# Patient Record
Sex: Female | Born: 1970 | Race: Black or African American | Hispanic: No | Marital: Married | Smoking: Never smoker
Health system: Southern US, Community
[De-identification: ages and names within clinical notes are randomized; demographics above are authoritative.]

---

## 2004-06-19 ENCOUNTER — Inpatient Hospital Stay (HOSPITAL_COMMUNITY): Admission: AD | Admit: 2004-06-19 | Discharge: 2004-06-20 | Payer: Self-pay | Admitting: Family Medicine

## 2004-08-02 ENCOUNTER — Emergency Department (HOSPITAL_COMMUNITY): Admission: EM | Admit: 2004-08-02 | Discharge: 2004-08-02 | Payer: Self-pay | Admitting: *Deleted

## 2004-08-08 ENCOUNTER — Emergency Department (HOSPITAL_COMMUNITY): Admission: EM | Admit: 2004-08-08 | Discharge: 2004-08-08 | Payer: Self-pay | Admitting: Family Medicine

## 2005-04-26 ENCOUNTER — Other Ambulatory Visit: Admission: RE | Admit: 2005-04-26 | Discharge: 2005-04-26 | Payer: Self-pay | Admitting: Obstetrics and Gynecology

## 2007-01-04 ENCOUNTER — Inpatient Hospital Stay (HOSPITAL_COMMUNITY): Admission: AD | Admit: 2007-01-04 | Discharge: 2007-01-07 | Payer: Self-pay | Admitting: Obstetrics and Gynecology

## 2007-04-18 ENCOUNTER — Emergency Department: Payer: Self-pay | Admitting: Emergency Medicine

## 2010-11-11 ENCOUNTER — Other Ambulatory Visit: Payer: Self-pay | Admitting: Obstetrics and Gynecology

## 2013-03-24 ENCOUNTER — Other Ambulatory Visit: Payer: Self-pay

## 2013-03-24 DIAGNOSIS — Z1231 Encounter for screening mammogram for malignant neoplasm of breast: Secondary | ICD-10-CM

## 2013-04-04 ENCOUNTER — Ambulatory Visit
Admission: RE | Admit: 2013-04-04 | Discharge: 2013-04-04 | Disposition: A | Discharge status: Home / Self Care | Payer: BC Managed Care – PPO | Source: Ambulatory Visit

## 2013-04-04 ENCOUNTER — Ambulatory Visit: Payer: Self-pay

## 2013-04-04 DIAGNOSIS — Z1231 Encounter for screening mammogram for malignant neoplasm of breast: Secondary | ICD-10-CM

## 2015-12-13 ENCOUNTER — Other Ambulatory Visit: Payer: Self-pay | Admitting: Internal Medicine

## 2015-12-13 DIAGNOSIS — Z1231 Encounter for screening mammogram for malignant neoplasm of breast: Secondary | ICD-10-CM

## 2015-12-21 ENCOUNTER — Ambulatory Visit
Admission: RE | Admit: 2015-12-21 | Discharge: 2015-12-21 | Disposition: A | Discharge status: Home / Self Care | Payer: BC Managed Care – PPO | Source: Ambulatory Visit | Attending: Internal Medicine | Admitting: Internal Medicine

## 2015-12-21 DIAGNOSIS — Z1231 Encounter for screening mammogram for malignant neoplasm of breast: Secondary | ICD-10-CM | POA: Insufficient documentation

## 2015-12-27 ENCOUNTER — Other Ambulatory Visit: Payer: Self-pay | Admitting: Internal Medicine

## 2015-12-27 DIAGNOSIS — R928 Other abnormal and inconclusive findings on diagnostic imaging of breast: Secondary | ICD-10-CM

## 2016-01-19 ENCOUNTER — Ambulatory Visit
Admission: RE | Admit: 2016-01-19 | Discharge: 2016-01-19 | Disposition: A | Discharge status: Home / Self Care | Payer: BC Managed Care – PPO | Source: Ambulatory Visit | Attending: Internal Medicine | Admitting: Internal Medicine

## 2016-01-19 DIAGNOSIS — N63 Unspecified lump in breast: Secondary | ICD-10-CM | POA: Diagnosis not present

## 2016-01-19 DIAGNOSIS — R928 Other abnormal and inconclusive findings on diagnostic imaging of breast: Secondary | ICD-10-CM

## 2017-02-19 ENCOUNTER — Other Ambulatory Visit: Payer: Self-pay | Admitting: Internal Medicine

## 2017-02-19 DIAGNOSIS — Z1231 Encounter for screening mammogram for malignant neoplasm of breast: Secondary | ICD-10-CM

## 2017-03-09 ENCOUNTER — Ambulatory Visit: Payer: BC Managed Care – PPO

## 2017-08-08 ENCOUNTER — Ambulatory Visit
Admission: RE | Admit: 2017-08-08 | Discharge: 2017-08-08 | Disposition: A | Discharge status: Home / Self Care | Payer: BC Managed Care – PPO | Source: Ambulatory Visit | Attending: Internal Medicine | Admitting: Internal Medicine

## 2017-08-08 DIAGNOSIS — Z1231 Encounter for screening mammogram for malignant neoplasm of breast: Secondary | ICD-10-CM | POA: Insufficient documentation

## 2017-12-25 ENCOUNTER — Other Ambulatory Visit: Payer: Self-pay | Admitting: Internal Medicine

## 2017-12-25 DIAGNOSIS — Z1231 Encounter for screening mammogram for malignant neoplasm of breast: Secondary | ICD-10-CM

## 2018-10-25 ENCOUNTER — Ambulatory Visit
Admission: RE | Admit: 2018-10-25 | Discharge: 2018-10-25 | Disposition: A | Discharge status: Home / Self Care | Payer: BC Managed Care – PPO | Source: Ambulatory Visit | Attending: Internal Medicine | Admitting: Internal Medicine

## 2018-10-25 DIAGNOSIS — Z1231 Encounter for screening mammogram for malignant neoplasm of breast: Secondary | ICD-10-CM | POA: Diagnosis present

## 2018-10-28 ENCOUNTER — Other Ambulatory Visit: Payer: Self-pay | Admitting: Internal Medicine

## 2018-10-29 ENCOUNTER — Other Ambulatory Visit: Payer: Self-pay | Admitting: Internal Medicine

## 2018-10-29 DIAGNOSIS — R928 Other abnormal and inconclusive findings on diagnostic imaging of breast: Secondary | ICD-10-CM

## 2018-10-29 DIAGNOSIS — N6320 Unspecified lump in the left breast, unspecified quadrant: Secondary | ICD-10-CM

## 2018-11-07 ENCOUNTER — Ambulatory Visit: Payer: BC Managed Care – PPO

## 2018-11-07 ENCOUNTER — Other Ambulatory Visit: Payer: BC Managed Care – PPO

## 2018-11-13 ENCOUNTER — Ambulatory Visit
Admission: RE | Admit: 2018-11-13 | Discharge: 2018-11-13 | Disposition: A | Discharge status: Home / Self Care | Payer: BC Managed Care – PPO | Source: Ambulatory Visit | Attending: Internal Medicine | Admitting: Internal Medicine

## 2018-11-13 DIAGNOSIS — R928 Other abnormal and inconclusive findings on diagnostic imaging of breast: Secondary | ICD-10-CM

## 2018-11-13 DIAGNOSIS — N6320 Unspecified lump in the left breast, unspecified quadrant: Secondary | ICD-10-CM | POA: Insufficient documentation

## 2018-11-14 ENCOUNTER — Other Ambulatory Visit: Payer: Self-pay | Admitting: Family

## 2018-11-14 DIAGNOSIS — N6320 Unspecified lump in the left breast, unspecified quadrant: Secondary | ICD-10-CM

## 2019-04-18 ENCOUNTER — Other Ambulatory Visit: Payer: Self-pay | Admitting: Internal Medicine

## 2019-04-18 DIAGNOSIS — N189 Chronic kidney disease, unspecified: Secondary | ICD-10-CM

## 2019-04-23 ENCOUNTER — Ambulatory Visit: Payer: BC Managed Care – PPO

## 2019-05-07 ENCOUNTER — Other Ambulatory Visit: Payer: Self-pay

## 2019-05-07 DIAGNOSIS — Z20822 Contact with and (suspected) exposure to covid-19: Secondary | ICD-10-CM

## 2019-05-08 LAB — NOVEL CORONAVIRUS, NAA: SARS-CoV-2, NAA: NOT DETECTED

## 2019-05-16 ENCOUNTER — Other Ambulatory Visit: Payer: BC Managed Care – PPO

## 2019-05-24 ENCOUNTER — Other Ambulatory Visit: Payer: Self-pay

## 2019-05-24 DIAGNOSIS — Z20822 Contact with and (suspected) exposure to covid-19: Secondary | ICD-10-CM

## 2019-05-25 LAB — NOVEL CORONAVIRUS, NAA: SARS-CoV-2, NAA: NOT DETECTED

## 2019-08-16 ENCOUNTER — Other Ambulatory Visit: Payer: Self-pay | Admitting: Internal Medicine

## 2019-08-16 DIAGNOSIS — Z20822 Contact with and (suspected) exposure to covid-19: Secondary | ICD-10-CM

## 2019-08-17 LAB — NOVEL CORONAVIRUS, NAA: SARS-CoV-2, NAA: DETECTED — AB

## 2019-08-18 ENCOUNTER — Telehealth: Payer: Self-pay | Admitting: Critical Care Medicine

## 2019-08-18 NOTE — Telephone Encounter (Signed)
I connected this patient had a Covid test November 7 that is positive.  The patient currently is having no symptoms.  She does have a primary care provider.  She knows she needs to stay in isolation from 10 days from the seventh which will be the 17th.  The patient works from home.  She has a primary care provider and will contact that physician if she needs further medical follow-up.

## 2019-09-13 IMAGING — MG DIGITAL DIAGNOSTIC UNILATERAL LEFT MAMMOGRAM WITH TOMO AND CAD
4 series · 4 of 12 positions shown · non-contrast
Comparison: Previous exam(s).

CLINICAL DATA: Patient was called back from screening mammogram for
a possible mass in the left breast.

EXAM:
DIGITAL DIAGNOSTIC LEFT MAMMOGRAM WITH TOMO
ULTRASOUND LEFT BREAST

[L MLO synth-2D]
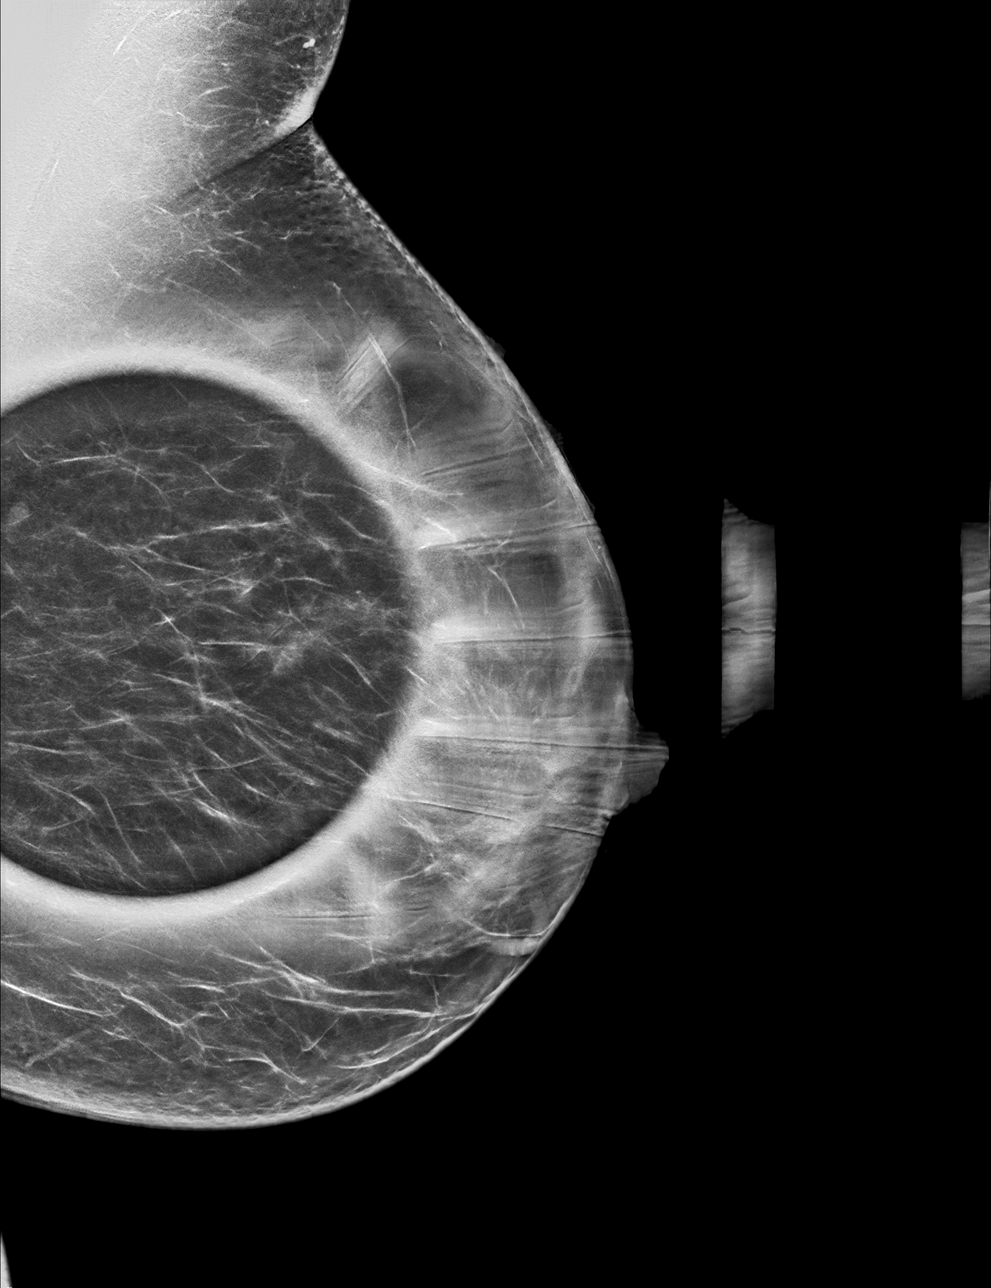

[L CC synth-2D]
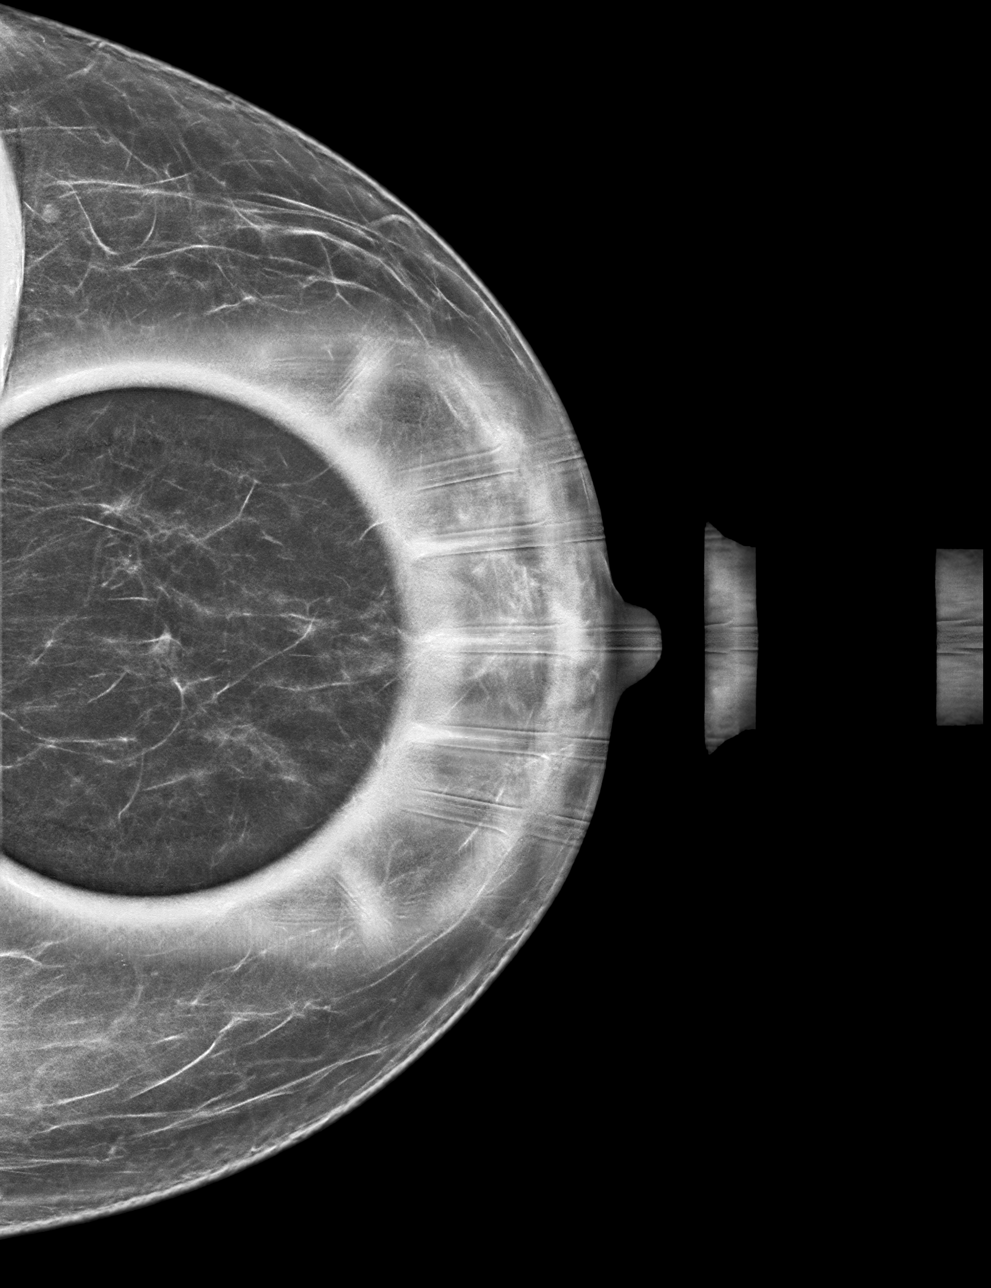

[L MLO tomo · tomo slice 41/80.0]
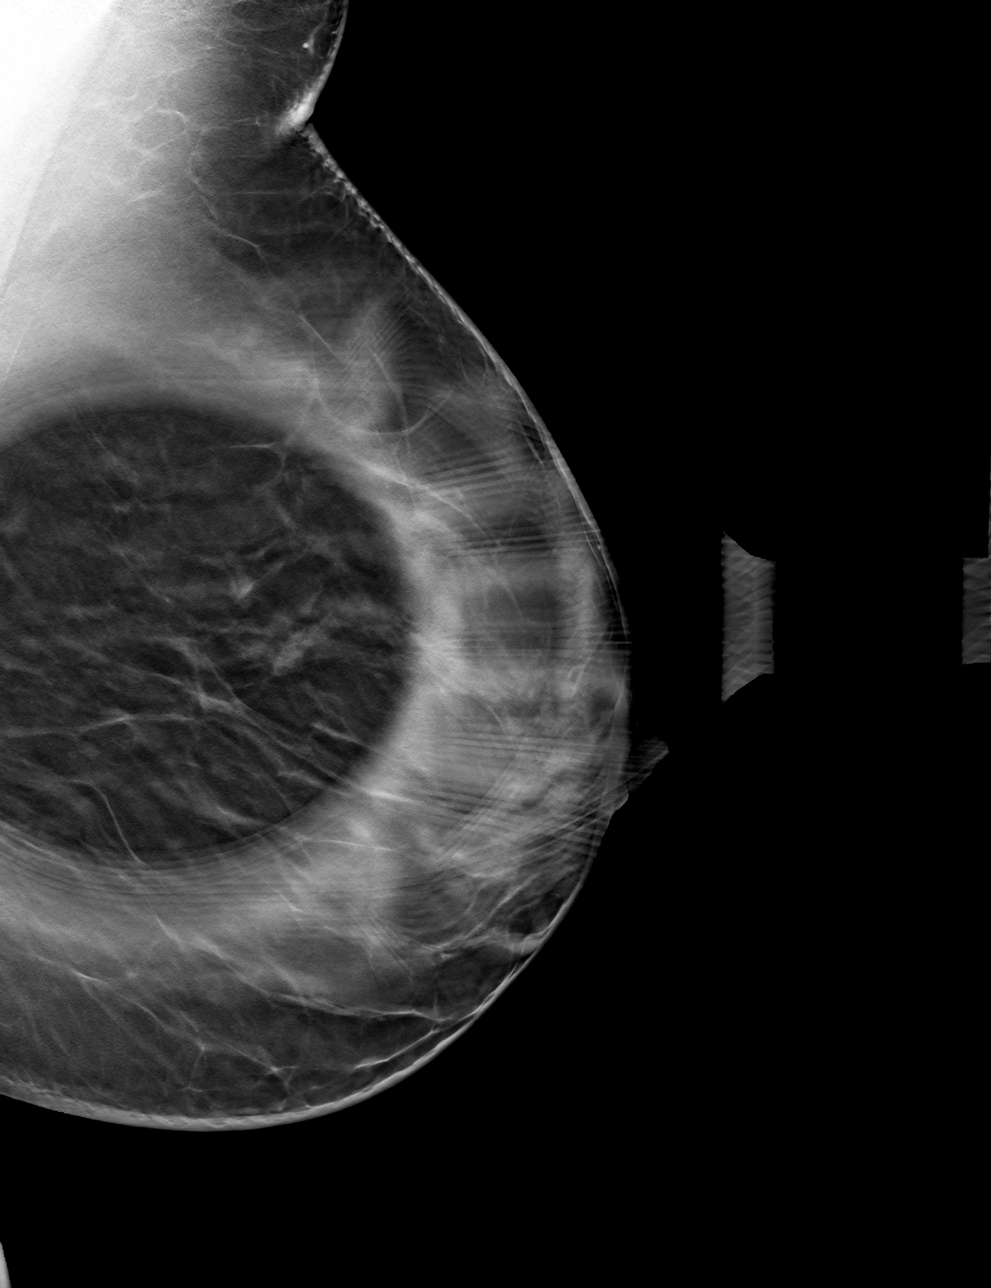

[L CC tomo · tomo slice 33/64.0]
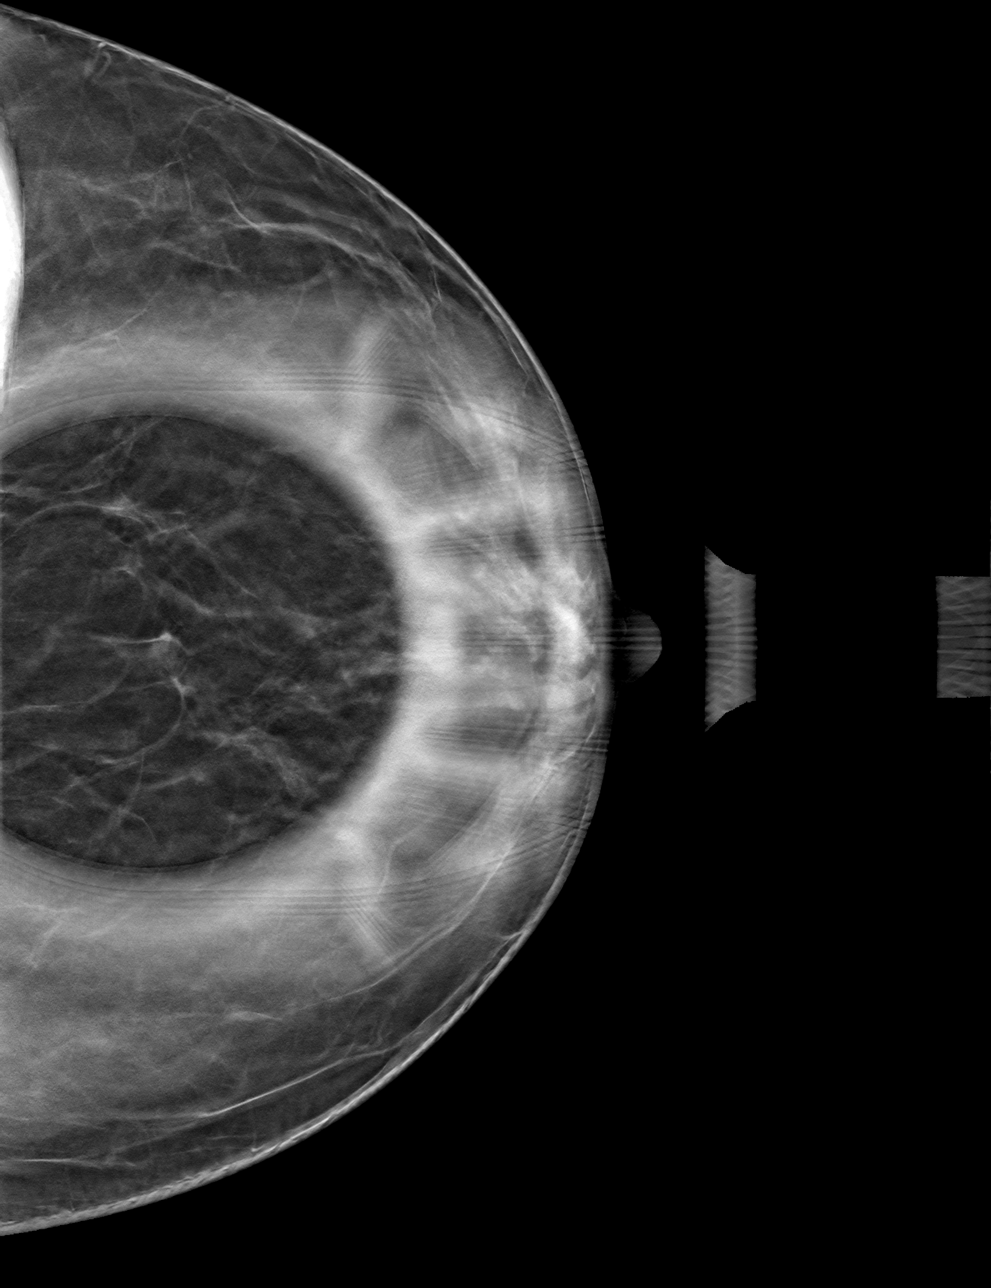

[4 of 12 positions shown; findings below may reference images not displayed]

ACR Breast Density Category b: There are scattered areas of
fibroglandular density.
FINDINGS: Additional imaging of the left breast was performed. There is
persistence of a 6 mm mass in the 12-1 o'clock region of the breast.

On physical exam, no mass is palpated in superior aspect of the
right breast.

Targeted ultrasound is performed, showing normal tissue in the
superior aspect of the right breast. No solid or cystic mass,
abnormal shadowing or distortion visualized.
IMPRESSION: Probable benign findings in the left breast.

RECOMMENDATION:
Short-term interval follow-up diagnostic left mammogram in 6 months
is recommended.

I have discussed the findings and recommendations with the patient.
Results were also provided in writing at the conclusion of the
visit. If applicable, a reminder letter will be sent to the patient
regarding the next appointment.

BI-RADS CATEGORY  3: Probably benign.

## 2019-12-06 ENCOUNTER — Other Ambulatory Visit: Payer: Self-pay

## 2019-12-06 ENCOUNTER — Ambulatory Visit: Payer: BC Managed Care – PPO

## 2019-12-07 ENCOUNTER — Ambulatory Visit: Payer: BC Managed Care – PPO | Attending: Internal Medicine

## 2019-12-07 DIAGNOSIS — Z23 Encounter for immunization: Secondary | ICD-10-CM

## 2019-12-07 NOTE — Progress Notes (Signed)
   Covid-19 Vaccination Clinic  Name:  Rachel Rocha    MRN: [id number] DOB: 1971-02-27  12/07/2019  [name] was observed post Covid-19 immunization for 15 minutes without incidence. She was provided with Vaccine Information Sheet and instruction to access the V-Safe system.   [name] was instructed to call 911 with any severe reactions post vaccine: Marland Kitchen Difficulty breathing  . Swelling of your face and throat  . A fast heartbeat  . A bad rash all over your body  . Dizziness and weakness    Immunizations Administered    Name Date Dose VIS Date Route   Pfizer COVID-19 Vaccine 12/07/2019  9:12 AM 0.3 mL 09/19/2019 Intramuscular   Manufacturer: [employer], [employer]   Lot: [id number]   NDC: [address]-7

## 2019-12-30 ENCOUNTER — Ambulatory Visit: Payer: BC Managed Care – PPO | Attending: Internal Medicine

## 2019-12-30 DIAGNOSIS — Z23 Encounter for immunization: Secondary | ICD-10-CM

## 2019-12-30 NOTE — Progress Notes (Signed)
   Covid-19 Vaccination Clinic  Name:  Rachel Rocha    MRN: [id number] DOB: February 02, 1971  12/30/2019  [name] was observed post Covid-19 immunization for 15 minutes without incident. She was provided with Vaccine Information Sheet and instruction to access the V-Safe system.   [name] was instructed to call 911 with any severe reactions post vaccine: Marland Kitchen Difficulty breathing  . Swelling of face and throat  . A fast heartbeat  . A bad rash all over body  . Dizziness and weakness   Immunizations Administered    Name Date Dose VIS Date Route   Pfizer COVID-19 Vaccine 12/30/2019  4:10 PM 0.3 mL 09/19/2019 Intramuscular   Manufacturer: [employer], [employer]   Lot: [id number]   NDC: [address]-3

## 2020-02-25 ENCOUNTER — Ambulatory Visit: Payer: BC Managed Care – PPO | Attending: Internal Medicine

## 2020-02-25 DIAGNOSIS — Z20822 Contact with and (suspected) exposure to covid-19: Secondary | ICD-10-CM

## 2020-02-26 LAB — SARS-COV-2, NAA 2 DAY TAT

## 2020-02-26 LAB — NOVEL CORONAVIRUS, NAA: SARS-CoV-2, NAA: NOT DETECTED

## 2020-04-27 ENCOUNTER — Other Ambulatory Visit: Payer: Self-pay

## 2020-05-04 ENCOUNTER — Other Ambulatory Visit: Payer: Self-pay

## 2020-05-07 ENCOUNTER — Other Ambulatory Visit: Payer: BC Managed Care – PPO

## 2020-06-29 ENCOUNTER — Other Ambulatory Visit: Payer: Self-pay

## 2020-06-29 DIAGNOSIS — Z20822 Contact with and (suspected) exposure to covid-19: Secondary | ICD-10-CM

## 2020-06-30 LAB — NOVEL CORONAVIRUS, NAA: SARS-CoV-2, NAA: NOT DETECTED

## 2020-06-30 LAB — SARS-COV-2, NAA 2 DAY TAT

## 2021-02-07 ENCOUNTER — Other Ambulatory Visit: Payer: Self-pay

## 2021-02-07 ENCOUNTER — Ambulatory Visit (INDEPENDENT_AMBULATORY_CARE_PROVIDER_SITE_OTHER): Payer: BC Managed Care – PPO | Admitting: Dermatology

## 2021-02-07 DIAGNOSIS — L659 Nonscarring hair loss, unspecified: Secondary | ICD-10-CM | POA: Diagnosis not present

## 2021-02-07 MED ORDER — DOXYCYCLINE HYCLATE 50 MG PO CAPS: 50.0000 mg | ORAL_CAPSULE | Freq: Every day | ORAL | 2 refills | Status: AC

## 2021-02-07 MED ORDER — KETOCONAZOLE 2 % EX SHAM: MEDICATED_SHAMPOO | CUTANEOUS | 2 refills | Status: AC

## 2021-02-07 NOTE — Progress Notes (Signed)
   New Patient Visit  Subjective  Rachel Rocha is a 50 y.o. female who presents for the following: Alopecia (Patient with hair loss for over 10 years. She was going to a dermatologist and was prescribed a shampoo to use many years ago but never noticed an improvement. ).  Patient advises since the hair loss has been going on for so long she does not feel like it's related to to any medications, illnesses or surgeries. She has not had any recent labs done.   The following portions of the chart were reviewed this encounter and updated as appropriate:   Allergies  Meds  Problems  Med Hx  Surg Hx  Fam Hx      Review of Systems:  No other skin or systemic complaints except as noted in HPI or Assessment and Plan.  Objective  Well appearing patient in no apparent distress; mood and affect are within normal limits.  A focused examination was performed including scalp. Relevant physical exam findings are noted in the Assessment and Plan.  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   Patient is not having any symptoms with hair loss.  Detailed discussion.  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  I gave an article to the patient from the [place] Journal of Medicine 380; 9 February 28th 2019  Reasoning for treatment is to decrease progression of condition and decrease any symptoms and inflammation.  Regrowth of already scarred areas where she  has hair loss is not likely to occur.  She understands.  Start doxycycline 50mg  1 PO QD with food. Start ketoconazole 2% shampoo twice weekly, massage into scalp and let sit 5 minutes before rinsing.   Doxycycline should be taken with food to prevent nausea. Do not lay down for 30 minutes after taking. Be cautious with sun exposure and use good sun protection while on this medication. Pregnant women should not take this medication.   Ordered Medications: doxycycline (VIBRAMYCIN) 50 MG capsule ketoconazole (NIZORAL) 2 % shampoo  Return in about 3 months (around 05/10/2021).  07/10/2021, RMA, am acting as scribe for Anise Salvo, MD . Documentation: I have reviewed the above documentation for accuracy and completeness, and I agree with the above.  Armida Sans, MD

## 2021-02-07 NOTE — Patient Instructions (Signed)
Central Centrifugal Cicatricial Alopecia  Doxycycline should be taken with food to prevent nausea. Do not lay down for 30 minutes after taking. Be cautious with sun exposure and use good sun protection while on this medication. Pregnant women should not take this medication.    If you have any questions or concerns for your doctor, please call our main line at [phone] and press option 4 to reach your doctor's medical assistant. If no one answers, please leave a voicemail as directed and we will return your call as soon as possible. Messages left after 4 pm will be answered the following business day.   You may also send [place] a message via MyChart. We typically respond to MyChart messages within 1-2 business days.  For prescription refills, please ask your pharmacy to contact our office. Our fax number is [phone].  If you have an urgent issue when the clinic is closed that cannot wait until the next business day, you can page your doctor at the number below.    Please note that while we do our best to be available for urgent issues outside of office hours, we are not available 24/7.   If you have an urgent issue and are unable to reach [place], you may choose to seek medical care at your doctor's office, retail clinic, urgent care center, or emergency room.  If you have a medical emergency, please immediately call 911 or go to the emergency department.  Pager Numbers  - Dr. Gwen Pounds: [phone]  - Dr. Neale Burly: [phone]  - Dr. Roseanne Reno: [phone]  In the event of inclement weather, please call our main line at [phone] for an update on the status of any delays or closures.  Dermatology Medication Tips: Please keep the boxes that topical medications come in in order to help keep track of the instructions about where and how to use these. Pharmacies typically print the medication instructions only on the boxes and not directly on the medication tubes.   If your medication is  too expensive, please contact our office at [phone] option 4 or send [place] a message through MyChart.   We are unable to tell what your co-pay for medications will be in advance as this is different depending on your insurance coverage. However, we may be able to find a substitute medication at lower cost or fill out paperwork to get insurance to cover a needed medication.   If a prior authorization is required to get your medication covered by your insurance company, please allow [place] 1-2 business days to complete this process.  Drug prices often vary depending on where the prescription is filled and some pharmacies may offer cheaper prices.  The website www.goodrx.com contains coupons for medications through different pharmacies. The prices here do not account for what the cost may be with help from insurance (it may be cheaper with your insurance), but the website can give you the price if you did not use any insurance.  - You can print the associated coupon and take it with your prescription to the pharmacy.  - You may also stop by our office during regular business hours and pick up a GoodRx coupon card.  - If you need your prescription sent electronically to a different pharmacy, notify our office through [facility] or by phone at [phone] option 4.

## 2021-02-10 ENCOUNTER — Encounter: Payer: Self-pay | Admitting: Dermatology

## 2021-05-04 ENCOUNTER — Ambulatory Visit: Payer: BC Managed Care – PPO | Admitting: Dermatology

## 2022-01-31 ENCOUNTER — Encounter: Payer: Self-pay | Admitting: Internal Medicine

## 2022-02-15 ENCOUNTER — Telehealth: Payer: Self-pay

## 2022-02-15 NOTE — Telephone Encounter (Signed)
Called to schedule patient stated her insurance had expired now and she cant pay for out of pocket but will call us back when she is able to get new insurance  ?

## 2022-06-23 ENCOUNTER — Ambulatory Visit (LOCAL_COMMUNITY_HEALTH_CENTER): Payer: Self-pay

## 2022-06-23 DIAGNOSIS — Z111 Encounter for screening for respiratory tuberculosis: Secondary | ICD-10-CM

## 2022-06-26 ENCOUNTER — Ambulatory Visit (LOCAL_COMMUNITY_HEALTH_CENTER): Payer: Self-pay

## 2022-06-26 DIAGNOSIS — Z111 Encounter for screening for respiratory tuberculosis: Secondary | ICD-10-CM

## 2022-06-26 LAB — TB SKIN TEST
Induration: 0 mm
TB Skin Test: NEGATIVE

## 2023-03-07 ENCOUNTER — Other Ambulatory Visit: Payer: Self-pay | Admitting: Internal Medicine

## 2024-05-02 ENCOUNTER — Encounter: Payer: Self-pay | Admitting: Internal Medicine

## 2024-05-02 ENCOUNTER — Ambulatory Visit (INDEPENDENT_AMBULATORY_CARE_PROVIDER_SITE_OTHER): Admitting: Internal Medicine

## 2024-05-02 VITALS — BP 140/94 | HR 81 | Ht 69.0 in | Wt 279.4 lb

## 2024-05-02 DIAGNOSIS — E782 Mixed hyperlipidemia: Secondary | ICD-10-CM

## 2024-05-02 DIAGNOSIS — I10 Essential (primary) hypertension: Secondary | ICD-10-CM | POA: Diagnosis not present

## 2024-05-02 DIAGNOSIS — R7303 Prediabetes: Secondary | ICD-10-CM

## 2024-05-02 DIAGNOSIS — R5383 Other fatigue: Secondary | ICD-10-CM

## 2024-05-02 DIAGNOSIS — E559 Vitamin D deficiency, unspecified: Secondary | ICD-10-CM | POA: Diagnosis not present

## 2024-05-02 NOTE — Progress Notes (Addendum)
 Established Patient Office Visit  Subjective:  Patient ID: Rachel Rocha, female    DOB: 1971/05/05  Age: 53 y.o. MRN: [id number]  Chief Complaint  Patient presents with   Follow-up    Patient not in since April of 2023.  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  Her systolic blood pressure was above 170 at least 3 times.  However today it is 140/94.  Patient does have a history of prehypertension, high cholesterol and prediabetes.  She had been reluctant to take cholesterol medications in the past.  Now she has also gained weight.  Patient mentions that she is postmenopausal since age 89.  No menstrual cycle for 2 years.  Now she is experiencing hot flashes, mood swings, poor sleep and weight gain. She is overdue for her Pap, mammogram, and will need colon cancer screening.  Please schedule a physical in 3 weeks.  Meanwhile she needs to start avoiding salt, reduce her calorie intake and try to lose weight.  Will be monitoring her blood pressure closely and start medication.  Patient will get fasting blood work today.    No other concerns at this time.   No past medical history on file.  No past surgical history on file.  Social History   Socioeconomic History   Marital status: Married    Spouse name: Not on file   Number of children: Not on file   Years of education: Not on file   Highest education level: Not on file  Occupational History   Not on file  Tobacco Use   Smoking status: Not on file   Smokeless tobacco: Not on file  Substance and Sexual Activity   Alcohol use: Not on file   Drug use: Not on file   Sexual activity: Not on file  Other Topics Concern   Not on file  Social History Narrative   Not on file   Social Drivers of Health   Financial Resource Strain: Not on file  Food Insecurity: Not on file  Transportation Needs: Not on file  Physical Activity: Not on file  Stress: Not on file  Social  Connections: Unknown (02/21/2022)   Received from [facility]   Social Network    Social Network: Not on file  Intimate Partner Violence: Unknown (01/13/2022)   Received from [facility]   HITS    Physically Hurt: Not on file    Insult or Talk Down To: Not on file    Threaten Physical Harm: Not on file    Scream or Curse: Not on file    Family History  Problem Relation Age of Onset   Breast cancer Neg Hx     Allergies  Allergen Reactions   Iodinated Contrast Media Itching   Shellfish Allergy     Other reaction(s): Unknown    Outpatient Medications Prior to Visit  Medication Sig   albuterol (VENTOLIN) (5 MG/ML) 0.5% NEBU Inhale into the lungs.   atorvastatin (LIPITOR) 20 MG tablet Take 1 tablet by mouth daily. (Patient not taking: Reported on 05/02/2024)   doxycycline  (VIBRAMYCIN ) 50 MG capsule Take 1 capsule (50 mg total) by mouth daily. Take with food (Patient not taking: Reported on 05/02/2024)   ketoconazole  (NIZORAL ) 2 % shampoo Apply topically 2 (two) times a week. Massage into scalp and let sit 5 minutes before rinsing.   No facility-administered medications prior to visit.    Review of Systems  Constitutional:  Positive  for diaphoresis and malaise/fatigue. Negative for chills, fever and weight loss.  HENT: Negative.  Negative for sore throat.   Eyes: Negative.   Respiratory: Negative.  Negative for cough and shortness of breath.   Cardiovascular: Negative.  Negative for chest pain, palpitations and leg swelling.  Gastrointestinal: Negative.  Negative for abdominal pain, constipation, diarrhea, heartburn, nausea and vomiting.  Genitourinary: Negative.  Negative for dysuria and flank pain.  Musculoskeletal: Negative.  Negative for joint pain and myalgias.  Skin: Negative.   Neurological: Negative.  Negative for dizziness, tingling and headaches.  Endo/Heme/Allergies: Negative.   Psychiatric/Behavioral: Negative.  Negative for depression and suicidal ideas. The  patient is not nervous/anxious.        Objective:   BP (!) 140/94   Pulse 81   Ht 5' 9 (1.753 m)   Wt 279 lb 6.4 oz (126.7 kg)   SpO2 97%   BMI 41.26 kg/m   Vitals:   05/02/24 1504  BP: (!) 140/94  Pulse: 81  Height: 5' 9 (1.753 m)  Weight: 279 lb 6.4 oz (126.7 kg)  SpO2: 97%  BMI (Calculated): 41.24    Physical Exam Vitals and nursing note reviewed.  Constitutional:      Appearance: Normal appearance.  HENT:     Head: Normocephalic and atraumatic.     Nose: Nose normal.     Mouth/Throat:     Mouth: Mucous membranes are moist.     Pharynx: Oropharynx is clear.  Eyes:     Conjunctiva/sclera: Conjunctivae normal.     Pupils: Pupils are equal, round, and reactive to light.  Cardiovascular:     Rate and Rhythm: Normal rate and regular rhythm.     Pulses: Normal pulses.     Heart sounds: Normal heart sounds. No murmur heard. Pulmonary:     Effort: Pulmonary effort is normal.     Breath sounds: Normal breath sounds. No wheezing.  Abdominal:     General: Bowel sounds are normal.     Palpations: Abdomen is soft.     Tenderness: There is no abdominal tenderness. There is no right CVA tenderness or left CVA tenderness.  Musculoskeletal:        General: Normal range of motion.     Cervical back: Normal range of motion.     Right lower leg: No edema.     Left lower leg: No edema.  Skin:    General: Skin is warm and dry.  Neurological:     General: No focal deficit present.     Mental Status: She is alert and oriented to person, place, and time.  Psychiatric:        Mood and Affect: Mood normal.        Behavior: Behavior normal.      No results found for any visits on 05/02/24.  No results found for this or any previous visit (from the past 2160 hours).    Assessment & Plan:  Check labs today.  Strict diet control and salt restriction emphasized.  Monitor blood pressure at home. Problem List Items Addressed This Visit     Mixed hyperlipidemia    Relevant Orders   Lipid Panel w/o Chol/HDL Ratio   Essential hypertension, benign - Primary   Relevant Orders   CMP14+EGFR   Prediabetes   Relevant Orders   Hemoglobin A1c   Vitamin D  deficiency   Relevant Orders   Vitamin D  (25 hydroxy)   Other Visit Diagnoses       Other fatigue  Relevant Orders   CBC with Diff   TSH       Return in about 3 weeks (around 05/23/2024).   Total time spent: 30 minutes  FERNAND FREDY RAMAN, MD  05/02/2024   This document may have been prepared by [facility] Voice Recognition software and as such may include unintentional dictation errors.

## 2024-05-03 LAB — HEMOGLOBIN A1C
Est. average glucose Bld gHb Est-mCnc: 151 mg/dL
Hgb A1c MFr Bld: 6.9 % — ABNORMAL HIGH (ref 4.8–5.6)

## 2024-05-03 LAB — CBC WITH DIFFERENTIAL/PLATELET
Basophils Absolute: 0 x10E3/uL (ref 0.0–0.2)
Basos: 0 %
EOS (ABSOLUTE): 0.2 x10E3/uL (ref 0.0–0.4)
Eos: 3 %
Hematocrit: 39.6 % (ref 34.0–46.6)
Hemoglobin: 12.7 g/dL (ref 11.1–15.9)
Immature Grans (Abs): 0 x10E3/uL (ref 0.0–0.1)
Immature Granulocytes: 0 %
Lymphocytes Absolute: 2 x10E3/uL (ref 0.7–3.1)
Lymphs: 32 %
MCH: 28.6 pg (ref 26.6–33.0)
MCHC: 32.1 g/dL (ref 31.5–35.7)
MCV: 89 fL (ref 79–97)
Monocytes Absolute: 0.4 x10E3/uL (ref 0.1–0.9)
Monocytes: 7 %
Neutrophils Absolute: 3.6 x10E3/uL (ref 1.4–7.0)
Neutrophils: 58 %
Platelets: 213 x10E3/uL (ref 150–450)
RBC: 4.44 x10E6/uL (ref 3.77–5.28)
RDW: 12.7 % (ref 11.7–15.4)
WBC: 6.2 x10E3/uL (ref 3.4–10.8)

## 2024-05-03 LAB — CMP14+EGFR
ALT: 19 IU/L (ref 0–32)
AST: 19 IU/L (ref 0–40)
Albumin: 4.3 g/dL (ref 3.8–4.9)
Alkaline Phosphatase: 79 IU/L (ref 44–121)
BUN/Creatinine Ratio: 12 (ref 9–23)
BUN: 16 mg/dL (ref 6–24)
Bilirubin Total: 0.5 mg/dL (ref 0.0–1.2)
CO2: 22 mmol/L (ref 20–29)
Calcium: 10 mg/dL (ref 8.7–10.2)
Chloride: 101 mmol/L (ref 96–106)
Creatinine, Ser: 1.31 mg/dL — ABNORMAL HIGH (ref 0.57–1.00)
Globulin, Total: 3.4 g/dL (ref 1.5–4.5)
Glucose: 89 mg/dL (ref 70–99)
Potassium: 4.3 mmol/L (ref 3.5–5.2)
Sodium: 137 mmol/L (ref 134–144)
Total Protein: 7.7 g/dL (ref 6.0–8.5)
eGFR: 49 mL/min/1.73 — ABNORMAL LOW (ref >59)

## 2024-05-03 LAB — VITAMIN D 25 HYDROXY (VIT D DEFICIENCY, FRACTURES): Vit D, 25-Hydroxy: 28 ng/mL — ABNORMAL LOW (ref 30.0–100.0)

## 2024-05-03 LAB — LIPID PANEL W/O CHOL/HDL RATIO
Cholesterol, Total: 275 mg/dL — ABNORMAL HIGH (ref 100–199)
HDL: 74 mg/dL (ref >39)
LDL Chol Calc (NIH): 178 mg/dL — ABNORMAL HIGH (ref 0–99)
Triglycerides: 131 mg/dL (ref 0–149)
VLDL Cholesterol Cal: 23 mg/dL (ref 5–40)

## 2024-05-03 LAB — TSH: TSH: 1.63 u[IU]/mL (ref 0.450–4.500)

## 2024-05-05 ENCOUNTER — Ambulatory Visit: Payer: Self-pay | Admitting: Internal Medicine

## 2024-05-05 MED ORDER — LISINOPRIL 5 MG PO TABS: 5.0000 mg | ORAL_TABLET | Freq: Every day | ORAL | 3 refills | Status: DC

## 2024-05-05 MED ORDER — METFORMIN HCL ER 500 MG PO TB24: 500.0000 mg | ORAL_TABLET | Freq: Every day | ORAL | 6 refills | Status: AC

## 2024-05-05 MED ORDER — ROSUVASTATIN CALCIUM 10 MG PO TABS: 10.0000 mg | ORAL_TABLET | Freq: Every day | ORAL | 3 refills | Status: DC

## 2024-05-05 NOTE — Progress Notes (Signed)
 Patient notified

## 2024-05-29 ENCOUNTER — Encounter: Admitting: Internal Medicine

## 2024-08-01 ENCOUNTER — Ambulatory Visit: Payer: Self-pay | Admitting: Internal Medicine

## 2024-08-01 ENCOUNTER — Encounter: Payer: Self-pay | Admitting: Internal Medicine

## 2024-08-01 ENCOUNTER — Ambulatory Visit: Admitting: Internal Medicine

## 2024-08-01 ENCOUNTER — Ambulatory Visit (INDEPENDENT_AMBULATORY_CARE_PROVIDER_SITE_OTHER): Admitting: Internal Medicine

## 2024-08-01 VITALS — BP 132/100 | HR 85 | Ht 69.0 in | Wt 271.2 lb

## 2024-08-01 DIAGNOSIS — N760 Acute vaginitis: Secondary | ICD-10-CM | POA: Diagnosis not present

## 2024-08-01 DIAGNOSIS — A5901 Trichomonal vulvovaginitis: Secondary | ICD-10-CM

## 2024-08-01 DIAGNOSIS — I10 Essential (primary) hypertension: Secondary | ICD-10-CM

## 2024-08-01 DIAGNOSIS — E782 Mixed hyperlipidemia: Secondary | ICD-10-CM | POA: Diagnosis not present

## 2024-08-01 DIAGNOSIS — R7303 Prediabetes: Secondary | ICD-10-CM | POA: Diagnosis not present

## 2024-08-01 DIAGNOSIS — B009 Herpesviral infection, unspecified: Secondary | ICD-10-CM

## 2024-08-01 DIAGNOSIS — B9689 Other specified bacterial agents as the cause of diseases classified elsewhere: Secondary | ICD-10-CM

## 2024-08-01 LAB — POCT URINALYSIS DIPSTICK
Bilirubin, UA: NEGATIVE
Glucose, UA: NEGATIVE
Ketones, UA: NEGATIVE
Nitrite, UA: NEGATIVE
Protein, UA: NEGATIVE
Spec Grav, UA: >=1.03 — AB (ref 1.010–1.025)
Urobilinogen, UA: 0.2 U/dL
pH, UA: 5.5 (ref 5.0–8.0)

## 2024-08-01 NOTE — Progress Notes (Signed)
 Established Patient Office Visit  Subjective:  Patient ID: Rachel Rocha, female    DOB: [birth year]  Age: 53 y.o. MRN: [id number]  Chief Complaint  Patient presents with   Acute Visit    Vaginal irritation    Patient is here today for acute visit. She reports occasional internal vaginal discomfort; reports intermittent dryness and increased moisture. Denies fever, chills, burning with urination, odor, abnormal discharge, cramping. Patient endorses had ablation years ago and does not have cycles anymore. She reports symptoms onset was after sexual activity with her normal sexual partner.   She states she has been applying external OTC vagisil to the area without relief. Recommended patient to stop vagisil. Recommend patient start OTC probiotics, wash with only soap and water, use un fragrance washes and detergent, no douching. Will collect UV today and Nuswab.   UA showed trace blood and small leukocytes so will send for culture.     No other concerns at this time.   History reviewed. No pertinent past medical history.  History reviewed. No pertinent surgical history.  Social History   Socioeconomic History   Marital status: Married    Spouse name: Not on file   Number of children: Not on file   Years of education: Not on file   Highest education level: Not on file  Occupational History   Not on file  Tobacco Use   Smoking status: Never   Smokeless tobacco: Never  Substance and Sexual Activity   Alcohol use: Not Currently   Drug use: Never   Sexual activity: Not on file  Other Topics Concern   Not on file  Social History Narrative   Not on file   Social Drivers of Health   Financial Resource Strain: Not on file  Food Insecurity: Not on file  Transportation Needs: Not on file  Physical Activity: Not on file  Stress: Not on file  Social Connections: Unknown (02/21/2022)   Received from [facility]   Social Network    Social Network: Not on file  Intimate  Partner Violence: Unknown (01/13/2022)   Received from [facility]   HITS    Physically Hurt: Not on file    Insult or Talk Down To: Not on file    Threaten Physical Harm: Not on file    Scream or Curse: Not on file    Family History  Problem Relation Age of Onset   Breast cancer Neg Hx     Allergies  Allergen Reactions   Iodinated Contrast Media Itching   Shellfish Allergy     Other reaction(s): Unknown    Outpatient Medications Prior to Visit  Medication Sig   albuterol (VENTOLIN) (5 MG/ML) 0.5% NEBU Inhale into the lungs.   metFORMIN  (GLUCOPHAGE -XR) 500 MG 24 hr tablet Take 1 tablet (500 mg total) by mouth daily with breakfast.   rosuvastatin  (CRESTOR ) 10 MG tablet Take 1 tablet (10 mg total) by mouth daily.   doxycycline  (VIBRAMYCIN ) 50 MG capsule Take 1 capsule (50 mg total) by mouth daily. Take with food (Patient not taking: Reported on 08/01/2024)   ketoconazole  (NIZORAL ) 2 % shampoo Apply topically 2 (two) times a week. Massage into scalp and let sit 5 minutes before rinsing. (Patient not taking: Reported on 08/01/2024)   lisinopril  (ZESTRIL ) 5 MG tablet Take 1 tablet (5 mg total) by mouth daily. (Patient not taking: Reported on 08/01/2024)   No facility-administered medications prior to visit.    Review of Systems  Constitutional: Negative.  Negative  for chills, fever and malaise/fatigue.  HENT: Negative.  Negative for congestion and sore throat.   Eyes: Negative.  Negative for blurred vision and pain.  Respiratory: Negative.  Negative for cough and shortness of breath.   Cardiovascular: Negative.  Negative for chest pain, palpitations and leg swelling.  Gastrointestinal: Negative.  Negative for abdominal pain, blood in stool, constipation, diarrhea, heartburn, melena, nausea and vomiting.  Genitourinary: Negative.  Negative for dysuria, flank pain, frequency and urgency.  Musculoskeletal: Negative.  Negative for joint pain and myalgias.  Skin: Negative.    Neurological: Negative.  Negative for dizziness, tingling, sensory change, weakness and headaches.  Endo/Heme/Allergies: Negative.   Psychiatric/Behavioral: Negative.  Negative for depression and suicidal ideas. The patient is not nervous/anxious.        Objective:   BP (!) 132/100   Pulse 85   Ht 5' 9 (1.753 m)   Wt 271 lb 3.2 oz (123 kg)   SpO2 95%   BMI 40.05 kg/m   Vitals:   08/01/24 1459  BP: (!) 132/100  Pulse: 85  Height: 5' 9 (1.753 m)  Weight: 271 lb 3.2 oz (123 kg)  SpO2: 95%  BMI (Calculated): 40.03    Physical Exam Vitals and nursing note reviewed.  Constitutional:      Appearance: Normal appearance.  HENT:     Head: Normocephalic and atraumatic.     Nose: Nose normal.     Mouth/Throat:     Mouth: Mucous membranes are moist.     Pharynx: Oropharynx is clear.  Eyes:     Conjunctiva/sclera: Conjunctivae normal.     Pupils: Pupils are equal, round, and reactive to light.  Cardiovascular:     Rate and Rhythm: Normal rate and regular rhythm.     Pulses: Normal pulses.     Heart sounds: Normal heart sounds. No murmur heard. Pulmonary:     Effort: Pulmonary effort is normal.     Breath sounds: Normal breath sounds. No wheezing.  Abdominal:     General: Bowel sounds are normal.     Palpations: Abdomen is soft.     Tenderness: There is no abdominal tenderness. There is no right CVA tenderness or left CVA tenderness.  Musculoskeletal:        General: Normal range of motion.     Cervical back: Normal range of motion.     Right lower leg: No edema.     Left lower leg: No edema.  Skin:    General: Skin is warm and dry.  Neurological:     General: No focal deficit present.     Mental Status: She is alert and oriented to person, place, and time.  Psychiatric:        Mood and Affect: Mood normal.        Behavior: Behavior normal.      Results for orders placed or performed in visit on 08/01/24  POCT Urinalysis Dipstick (81002)  Result Value Ref  Range   Color, UA Yellow    Clarity, UA Cloudy    Glucose, UA Negative Negative   Bilirubin, UA Negative    Ketones, UA Negative    Spec Grav, UA >=1.030 (A) 1.010 - 1.025   Blood, UA Trace    pH, UA 5.5 5.0 - 8.0   Protein, UA Negative Negative   Urobilinogen, UA 0.2 0.2 or 1.0 E.U./dL   Nitrite, UA Negative    Leukocytes, UA Small (1+) (A) Negative   Appearance Cloudy    Odor Yes  Recent Results (from the past 2160 hours)  POCT Urinalysis Dipstick (18997)     Status: Abnormal   Collection Time: 08/01/24  3:28 PM  Result Value Ref Range   Color, UA Yellow    Clarity, UA Cloudy    Glucose, UA Negative Negative   Bilirubin, UA Negative    Ketones, UA Negative    Spec Grav, UA >=1.030 (A) 1.010 - 1.025   Blood, UA Trace    pH, UA 5.5 5.0 - 8.0   Protein, UA Negative Negative   Urobilinogen, UA 0.2 0.2 or 1.0 E.U./dL   Nitrite, UA Negative    Leukocytes, UA Small (1+) (A) Negative   Appearance Cloudy    Odor Yes       Assessment & Plan:  UA, Nuswab collected. UA sent for culture. Recommended OTC probiotics, wash with only soap and water, use un fragrance washes and detergent, no douching. Patient to return in 7-10 days if symptoms worsen or fail to improve to have pelvic exam as needed. Problem List Items Addressed This Visit     Mixed hyperlipidemia   Essential hypertension, benign - Primary   Prediabetes   Acute vaginitis   Relevant Orders   POCT Urinalysis Dipstick (18997) (Completed)   NuSwab VG+, HSV    Return in about 10 days (around 08/11/2024).   Total time spent: 25 minutes. This time includes review of previous notes and results and patient face to face interaction during today's visit.    FERNAND FREDY RAMAN, MD  08/01/2024   This document may have been prepared by [facility] Voice Recognition software and as such may include unintentional dictation errors.

## 2024-08-03 LAB — URINE CULTURE

## 2024-08-04 LAB — NUSWAB VG+, HSV
Atopobium vaginae: HIGH {score} — AB
BVAB 2: HIGH {score} — AB
Candida albicans, NAA: NEGATIVE
Candida glabrata, NAA: NEGATIVE
Chlamydia trachomatis, NAA: NEGATIVE
HSV 1 NAA: NEGATIVE
HSV 2 NAA: POSITIVE — AB
Neisseria gonorrhoeae, NAA: NEGATIVE
Trich vag by NAA: POSITIVE — AB

## 2024-08-04 MED ORDER — METRONIDAZOLE 500 MG PO TABS: 500.0000 mg | ORAL_TABLET | Freq: Two times a day (BID) | ORAL | 0 refills | Status: AC

## 2024-08-04 MED ORDER — VALACYCLOVIR HCL 1 G PO TABS: 1000.0000 mg | ORAL_TABLET | Freq: Two times a day (BID) | ORAL | 0 refills | Status: AC

## 2024-08-05 NOTE — Progress Notes (Signed)
 Patient notified

## 2024-08-18 ENCOUNTER — Encounter: Payer: Self-pay | Admitting: Internal Medicine

## 2024-08-18 ENCOUNTER — Ambulatory Visit: Admitting: Internal Medicine

## 2024-08-18 VITALS — BP 124/84 | HR 70 | Ht 69.0 in | Wt 274.8 lb

## 2024-08-18 DIAGNOSIS — E782 Mixed hyperlipidemia: Secondary | ICD-10-CM | POA: Diagnosis not present

## 2024-08-18 DIAGNOSIS — I10 Essential (primary) hypertension: Secondary | ICD-10-CM

## 2024-08-18 DIAGNOSIS — R7303 Prediabetes: Secondary | ICD-10-CM

## 2024-08-18 MED ORDER — ROSUVASTATIN CALCIUM 10 MG PO TABS: 10.0000 mg | ORAL_TABLET | Freq: Every day | ORAL | 3 refills | Status: AC

## 2024-08-18 MED ORDER — LISINOPRIL 5 MG PO TABS: 5.0000 mg | ORAL_TABLET | Freq: Every day | ORAL | 3 refills | Status: AC

## 2024-08-18 NOTE — Progress Notes (Signed)
 Established Patient Office Visit  Subjective:  Patient ID: Rachel Rocha, female    DOB: [birth year]  Age: 53 y.o. MRN: [id number]  Chief Complaint  Patient presents with   Follow-up    Patient comes in for her follow-up today.  At at her previous visit she had complained of vaginal discomfort.  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  Patient has completed a round of metronidazole as well as Valtrex.  Today she has no further complaints and is feeling very well.  She still has to get her partner treated as well. Patient admits that she is not taking her rosuvastatin  or lisinopril , thinks she ran out of her refills.  But she is taking metformin  regularly.  Will send in refills to the pharmacy.  Patient advised to resume her medications and to take them regularly along with strict diet control.  Will get her labs in 2 months.    No other concerns at this time.   History reviewed. No pertinent past medical history.  History reviewed. No pertinent surgical history.  Social History   Socioeconomic History   Marital status: Married    Spouse name: Not on file   Number of children: Not on file   Years of education: Not on file   Highest education level: Not on file  Occupational History   Not on file  Tobacco Use   Smoking status: Never   Smokeless tobacco: Never  Substance and Sexual Activity   Alcohol use: Not Currently   Drug use: Never   Sexual activity: Not on file  Other Topics Concern   Not on file  Social History Narrative   Not on file   Social Drivers of Health   Financial Resource Strain: Not on file  Food Insecurity: Not on file  Transportation Needs: Not on file  Physical Activity: Not on file  Stress: Not on file  Social Connections: Unknown (02/21/2022)   Received from [facility]   Social Network    Social Network: Not on file  Intimate Partner Violence: Unknown (01/13/2022)   Received from [facility]   HITS    Physically  Hurt: Not on file    Insult or Talk Down To: Not on file    Threaten Physical Harm: Not on file    Scream or Curse: Not on file    Family History  Problem Relation Age of Onset   Breast cancer Neg Hx     Allergies  Allergen Reactions   Iodinated Contrast Media Itching   Shellfish Allergy     Other reaction(s): Unknown    Outpatient Medications Prior to Visit  Medication Sig   albuterol (VENTOLIN) (5 MG/ML) 0.5% NEBU Inhale into the lungs.   metFORMIN  (GLUCOPHAGE -XR) 500 MG 24 hr tablet Take 1 tablet (500 mg total) by mouth daily with breakfast.   doxycycline  (VIBRAMYCIN ) 50 MG capsule Take 1 capsule (50 mg total) by mouth daily. Take with food (Patient not taking: Reported on 08/18/2024)   ketoconazole  (NIZORAL ) 2 % shampoo Apply topically 2 (two) times a week. Massage into scalp and let sit 5 minutes before rinsing. (Patient not taking: Reported on 08/18/2024)   metroNIDAZOLE (FLAGYL) 500 MG tablet Take 1 tablet (500 mg total) by mouth 2 (two) times daily. (Patient not taking: Reported on 08/18/2024)   [DISCONTINUED] lisinopril  (ZESTRIL ) 5 MG tablet Take 1 tablet (5 mg total) by mouth daily. (Patient not taking: Reported on 08/18/2024)   [DISCONTINUED] rosuvastatin  (CRESTOR ) 10 MG tablet  Take 1 tablet (10 mg total) by mouth daily. (Patient not taking: Reported on 08/18/2024)   No facility-administered medications prior to visit.    Review of Systems  Constitutional: Negative.  Negative for chills, fever and malaise/fatigue.  HENT: Negative.  Negative for congestion and sore throat.   Eyes: Negative.  Negative for blurred vision and pain.  Respiratory: Negative.  Negative for cough and shortness of breath.   Cardiovascular: Negative.  Negative for chest pain, palpitations and leg swelling.  Gastrointestinal: Negative.  Negative for abdominal pain, blood in stool, constipation, diarrhea, heartburn, melena, nausea and vomiting.  Genitourinary: Negative.  Negative for dysuria,  flank pain, frequency and urgency.  Musculoskeletal: Negative.  Negative for joint pain and myalgias.  Skin: Negative.   Neurological: Negative.  Negative for dizziness, tingling, sensory change, weakness and headaches.  Endo/Heme/Allergies: Negative.   Psychiatric/Behavioral: Negative.  Negative for depression and suicidal ideas. The patient is not nervous/anxious.        Objective:   BP 124/84   Pulse 70   Ht 5' 9 (1.753 m)   Wt 274 lb 12.8 oz (124.6 kg)   SpO2 99%   BMI 40.58 kg/m   Vitals:   08/18/24 1529  BP: 124/84  Pulse: 70  Height: 5' 9 (1.753 m)  Weight: 274 lb 12.8 oz (124.6 kg)  SpO2: 99%  BMI (Calculated): 40.56    Physical Exam Vitals and nursing note reviewed.  Constitutional:      Appearance: Normal appearance.  HENT:     Head: Normocephalic and atraumatic.     Nose: Nose normal.     Mouth/Throat:     Mouth: Mucous membranes are moist.     Pharynx: Oropharynx is clear.  Eyes:     Conjunctiva/sclera: Conjunctivae normal.     Pupils: Pupils are equal, round, and reactive to light.  Cardiovascular:     Rate and Rhythm: Normal rate and regular rhythm.     Pulses: Normal pulses.     Heart sounds: Normal heart sounds. No murmur heard. Pulmonary:     Effort: Pulmonary effort is normal.     Breath sounds: Normal breath sounds. No wheezing.  Abdominal:     General: Bowel sounds are normal.     Palpations: Abdomen is soft.     Tenderness: There is no abdominal tenderness. There is no right CVA tenderness or left CVA tenderness.  Musculoskeletal:        General: Normal range of motion.     Cervical back: Normal range of motion.     Right lower leg: No edema.     Left lower leg: No edema.  Skin:    General: Skin is warm and dry.  Neurological:     General: No focal deficit present.     Mental Status: She is alert and oriented to person, place, and time.  Psychiatric:        Mood and Affect: Mood normal.        Behavior: Behavior normal.       No results found for any visits on 08/18/24.  Recent Results (from the past 2160 hours)  POCT Urinalysis Dipstick (18997)     Status: Abnormal   Collection Time: 08/01/24  3:28 PM  Result Value Ref Range   Color, UA Yellow    Clarity, UA Cloudy    Glucose, UA Negative Negative   Bilirubin, UA Negative    Ketones, UA Negative    Spec Grav, UA >=1.030 (A) 1.010 - 1.025  Blood, UA Trace    pH, UA 5.5 5.0 - 8.0   Protein, UA Negative Negative   Urobilinogen, UA 0.2 0.2 or 1.0 E.U./dL   Nitrite, UA Negative    Leukocytes, UA Small (1+) (A) Negative   Appearance Cloudy    Odor Yes   NuSwab VG+, HSV     Status: Abnormal   Collection Time: 08/01/24  3:47 PM  Result Value Ref Range   Atopobium vaginae High - 2 (A) Score   BVAB 2 High - 2 (A) Score   Megasphaera 1 Low - 0 Score    Comment: Calculate total score by adding the 3 individual bacterial vaginosis (BV) marker scores together.  Total score is interpreted as follows: Total score 0-1: Indicates the absence of BV. Total score   2: Indeterminate for BV. Additional clinical                  data should be evaluated to establish a                  diagnosis. Total score 3-6: Indicates the presence of BV.    Candida albicans, NAA Negative Negative   Candida glabrata, NAA Negative Negative   Trich vag by NAA Positive (A) Negative   Chlamydia trachomatis, NAA Negative Negative   Neisseria gonorrhoeae, NAA Negative Negative   HSV 1 NAA Negative Negative   HSV 2 NAA Positive (A) Negative  Urine Culture     Status: None   Collection Time: 08/01/24  3:49 PM   Specimen: Urine   UR  Result Value Ref Range   Urine Culture, Routine Final report    Organism ID, Bacteria Comment     Comment: Culture shows less than 10,000 colony forming units of bacteria per milliliter of urine. This colony count is not generally considered to be clinically significant.       Assessment & Plan:  Patient to resume rosuvastatin ,  lisinopril  and to continue metformin .  Fasting labs in 2 months.  Strict diet control emphasized. Problem List Items Addressed This Visit     Mixed hyperlipidemia - Primary   Relevant Medications   rosuvastatin  (CRESTOR ) 10 MG tablet   lisinopril  (ZESTRIL ) 5 MG tablet   Essential hypertension, benign   Relevant Medications   rosuvastatin  (CRESTOR ) 10 MG tablet   lisinopril  (ZESTRIL ) 5 MG tablet   Prediabetes    Return in about 2 months (around 10/18/2024).   Total time spent: 30 minutes. This time includes review of previous notes and results and patient face to face interaction during today's visit.    FERNAND FREDY RAMAN, MD  08/18/2024   This document may have been prepared by [facility] Voice Recognition software and as such may include unintentional dictation errors.

## 2024-10-20 ENCOUNTER — Ambulatory Visit: Admitting: Internal Medicine
